# Patient Record
Sex: Male | Born: 2000 | Race: Black or African American | Hispanic: No | Marital: Single | Smoking: Never smoker
Health system: Southern US, Community
[De-identification: ages and names within clinical notes are randomized; demographics above are authoritative.]

## PROBLEM LIST (undated history)

## (undated) DIAGNOSIS — L309 Dermatitis, unspecified: Secondary | ICD-10-CM

## (undated) DIAGNOSIS — F909 Attention-deficit hyperactivity disorder, unspecified type: Secondary | ICD-10-CM

## (undated) HISTORY — DX: Dermatitis, unspecified: L30.9

## (undated) HISTORY — DX: Attention-deficit hyperactivity disorder, unspecified type: F90.9

## (undated) HISTORY — PX: OTHER SURGICAL HISTORY: SHX169

---
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
## 2021-02-11 ENCOUNTER — Ambulatory Visit (INDEPENDENT_AMBULATORY_CARE_PROVIDER_SITE_OTHER): Payer: 59 | Admitting: "Endocrinology

## 2021-03-12 ENCOUNTER — Ambulatory Visit (INDEPENDENT_AMBULATORY_CARE_PROVIDER_SITE_OTHER): Payer: 59 | Admitting: "Endocrinology

## 2022-02-03 ENCOUNTER — Encounter (INDEPENDENT_AMBULATORY_CARE_PROVIDER_SITE_OTHER): Payer: Self-pay
